# Patient Record
Sex: Female | Born: 1960 | Race: White | Hispanic: No | Smoking: Never smoker
Health system: Southern US, Community
[De-identification: ages and names within clinical notes are randomized; demographics above are authoritative.]

## PROBLEM LIST (undated history)

## (undated) DIAGNOSIS — E119 Type 2 diabetes mellitus without complications: Secondary | ICD-10-CM

## (undated) DIAGNOSIS — I10 Essential (primary) hypertension: Secondary | ICD-10-CM

## (undated) HISTORY — PX: CHOLECYSTECTOMY: SHX55

---

## 2012-10-19 ENCOUNTER — Emergency Department: Payer: Self-pay | Admitting: Internal Medicine

## 2015-09-24 ENCOUNTER — Other Ambulatory Visit: Payer: Self-pay | Admitting: Rehabilitation

## 2015-09-24 DIAGNOSIS — M5416 Radiculopathy, lumbar region: Secondary | ICD-10-CM

## 2015-09-30 ENCOUNTER — Ambulatory Visit
Admission: RE | Admit: 2015-09-30 | Discharge: 2015-09-30 | Disposition: A | Discharge status: Home / Self Care | Payer: Managed Care, Other (non HMO) | Source: Ambulatory Visit | Attending: Rehabilitation | Admitting: Rehabilitation

## 2015-09-30 DIAGNOSIS — M5416 Radiculopathy, lumbar region: Secondary | ICD-10-CM

## 2017-04-04 IMAGING — MR MR LUMBAR SPINE W/O CM
4 of 5 series · 18 of 48 positions shown · non-contrast
Comparison: None.

CLINICAL DATA: Increased low back pain for 1 year. Right bilateral
buttock pain.

EXAM:
MRI LUMBAR SPINE WITHOUT CONTRAST
TECHNIQUE: Multiplanar, multisequence MR imaging of the lumbar spine was
performed. No intravenous contrast was administered.

[Series 6: T2 · sagittal · 4.0mm · 0.73mm/px · 6 of 15 slices shown (1 of 2)]
[im 1/15]
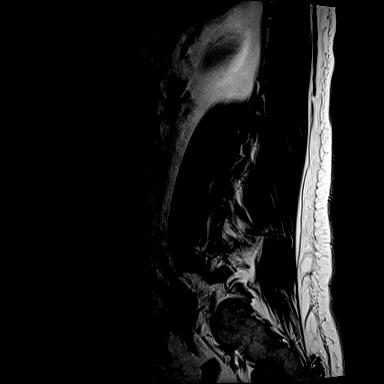
[im 3/15]
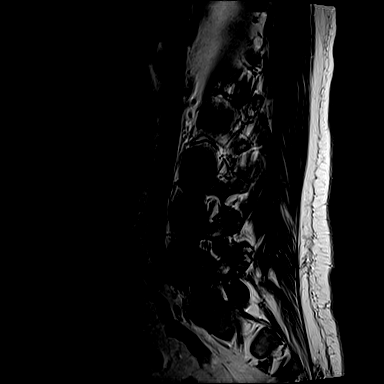
[im 6/15]
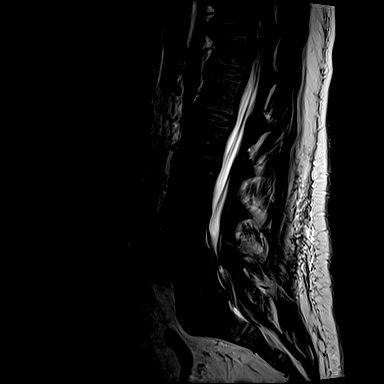
[im 9/15]
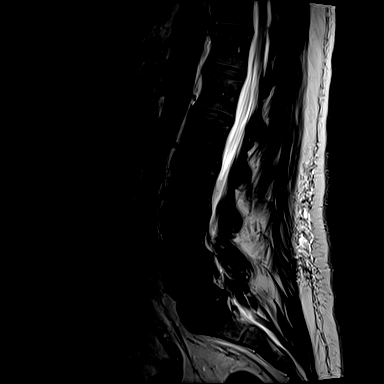
[im 12/15]
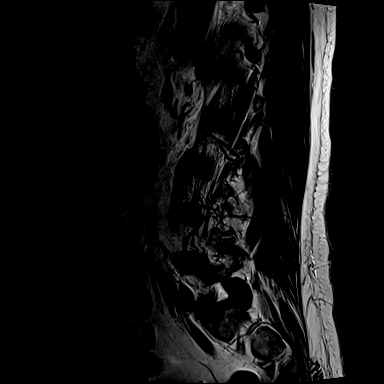
[im 15/15]
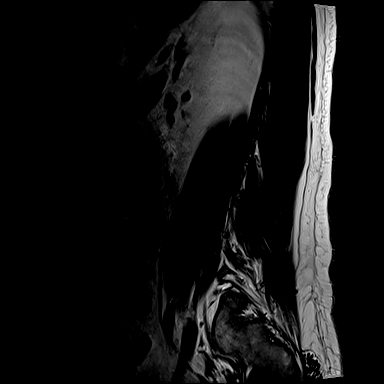

[Series 8: T1 · sagittal · 4.0mm · 0.73mm/px · 3 of 15 slices shown (1 of 2)]
[im 3/15]
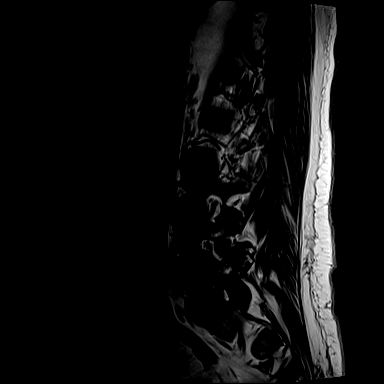
[im 9/15]
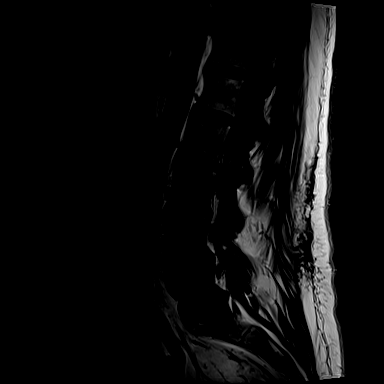
[im 15/15]
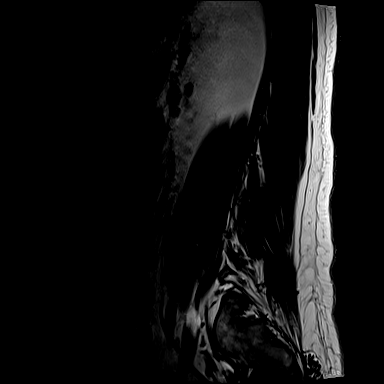

[Series 13: T2 · axial · 4.0mm · 0.28mm/px · z∈[-117,+50]mm · 6 of 37 slices shown (2 of 2)]
[im 1/37]
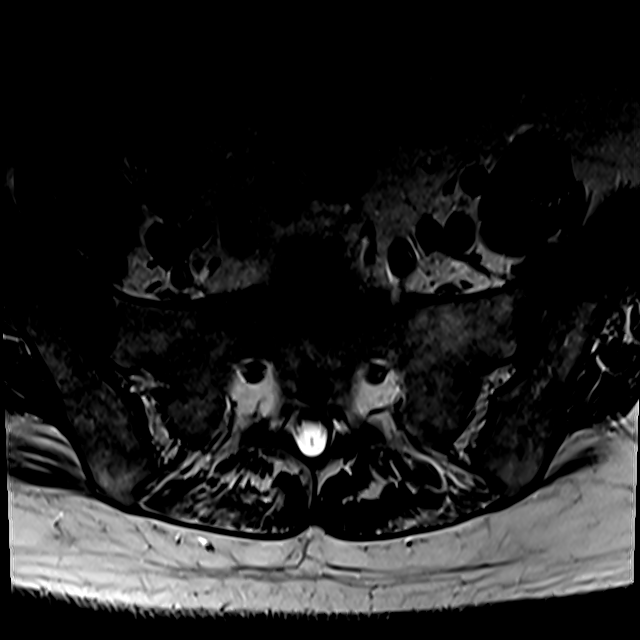
[im 6/37]
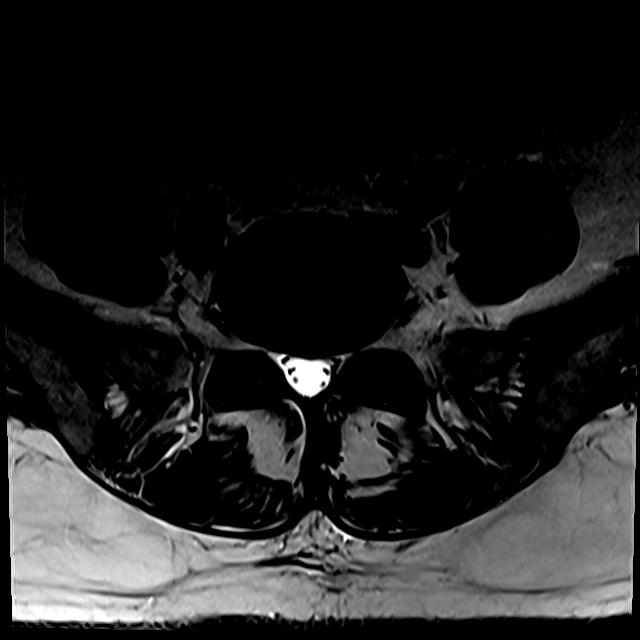
[im 11/37]
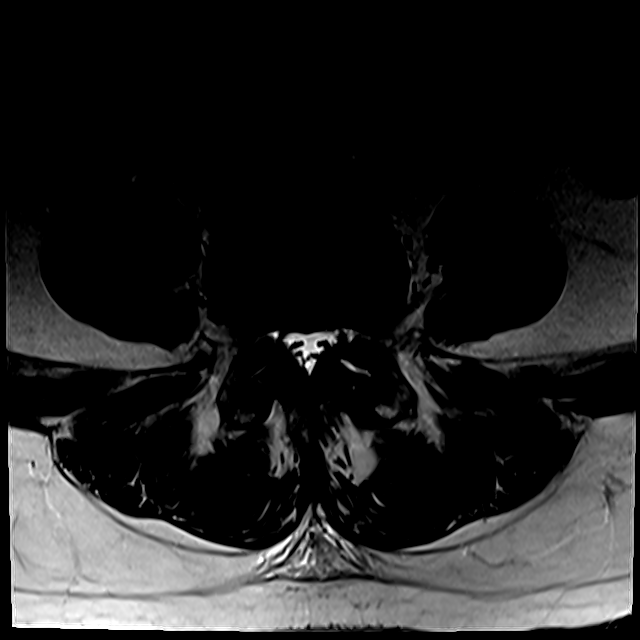
[im 16/37]
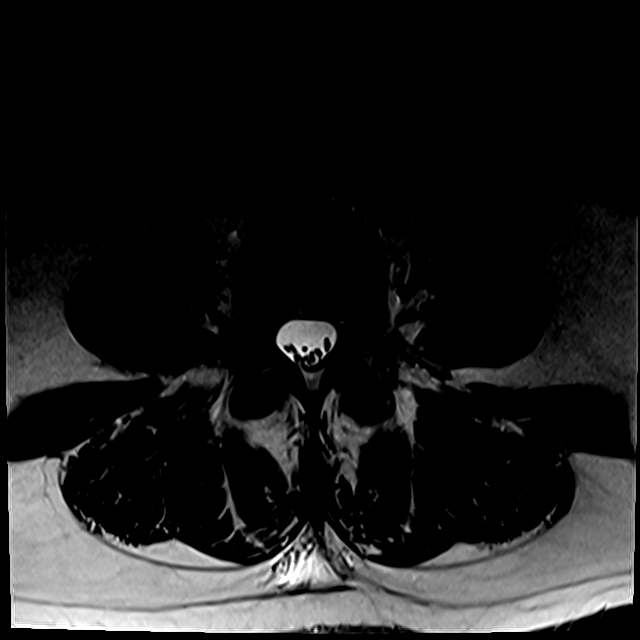
[im 19/37]
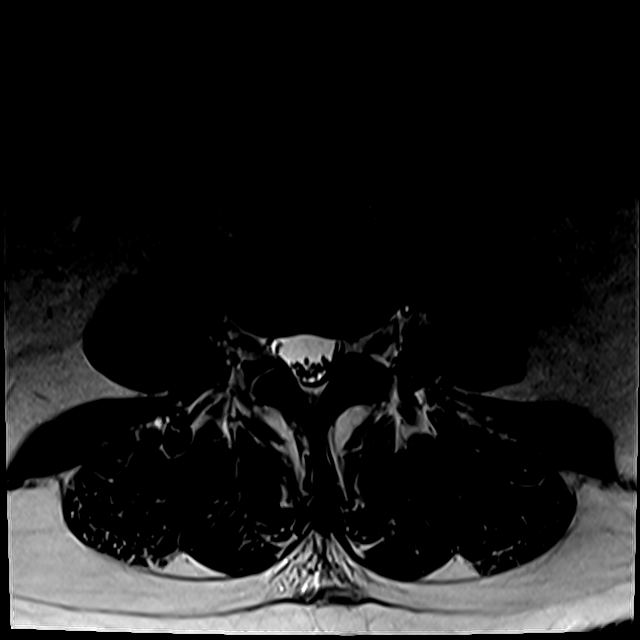
[im 31/37]
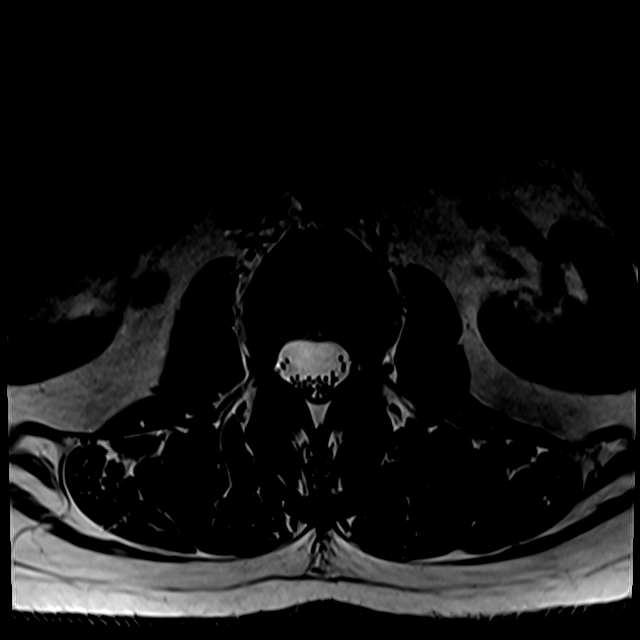

[Series 100: T1 · axial · 4.0mm · 0.28mm/px · z∈[-92,+50]mm · 3 of 37 slices shown (2 of 2)]
[im 6/37]
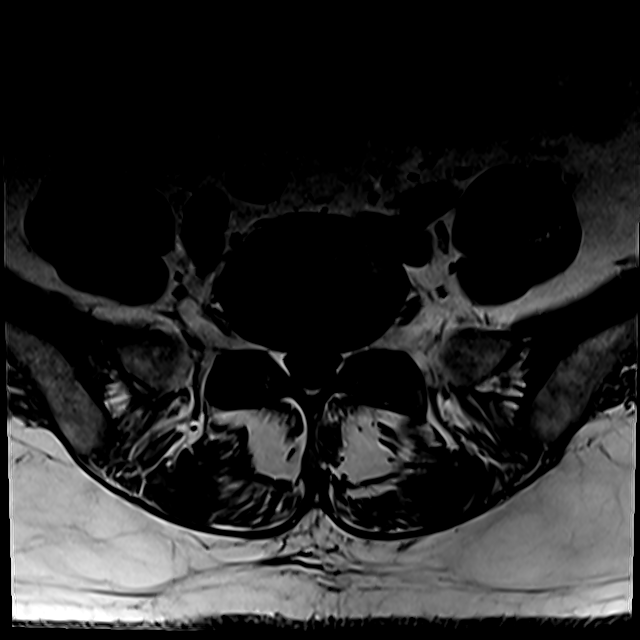
[im 19/37]
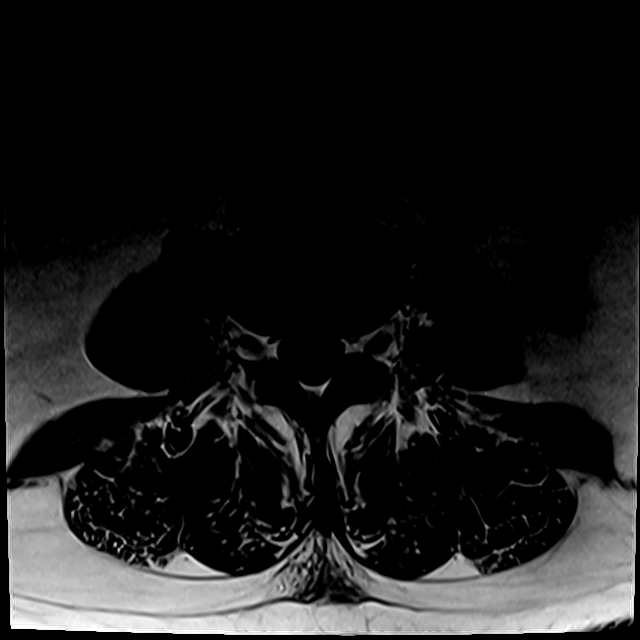
[im 31/37]
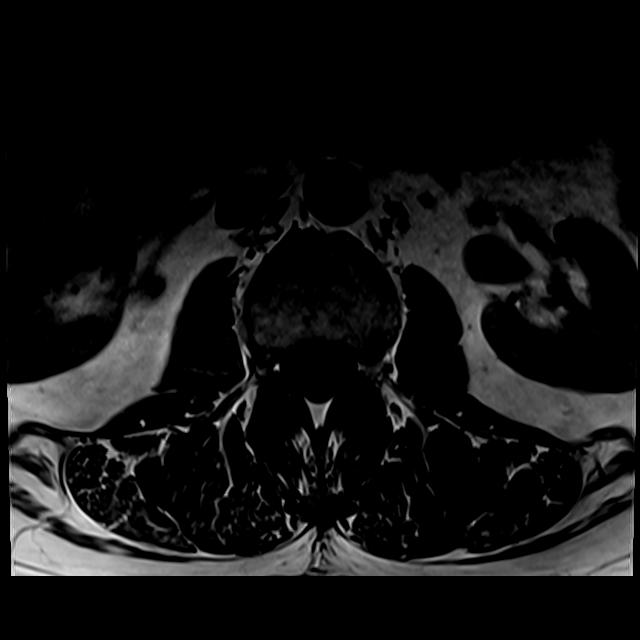

[18 of 48 positions shown; findings below may reference images not displayed]

FINDINGS: Segmentation:  Standard.

Alignment: 4 mm of anterolisthesis of L4 on L5 secondary to
bilateral facet disease.

Vertebrae:  No fracture, evidence of discitis, or bone lesion.

Conus medullaris: Extends to the T12 level and appears normal.

Paraspinal and other soft tissues: Negative.

Disc levels:

Disc spaces: Disc desiccation at L3-4, L4-5 and L5-S1.

T11-12: On the sagittal images there is a mild broad-based disc
bulge without foraminal or central canal stenosis.

T12-L1: On the sagittal images, there is no significant disc bulge.
No evidence of neural foraminal stenosis. No central canal stenosis.

L1-L2: No significant disc bulge. No evidence of neural foraminal
stenosis. No central canal stenosis.

L2-L3: No significant disc bulge. No evidence of neural foraminal
stenosis. No central canal stenosis.

L3-L4: Mild broad-based disc bulge. Mild bilateral facet
arthropathy. No evidence of neural foraminal stenosis. No central
canal stenosis.

L4-L5: Mild broad-based disc bulge flattening the ventral thecal
sac. Severe bilateral facet arthropathy with ligamentum flavum
infolding resulting in moderate spinal stenosis and right lateral
recess stenosis. No evidence of neural foraminal stenosis.

L5-S1: Broad central disc protrusion abutting the intraspinal S1
nerve roots bilaterally. Mild bilateral facet arthropathy. No
evidence of neural foraminal stenosis. No central canal stenosis.
IMPRESSION: 1. At L4-5 there is a mild broad-based disc bulge flattening the
ventral thecal sac. Severe bilateral facet arthropathy with
ligamentum flavum infolding resulting in moderate spinal stenosis
and right lateral recess stenosis. Grade 1 anterolisthesis of L4 on
L5.
2. At L5-S1 there is a broad central disc protrusion abutting the
intraspinal S1 nerve roots bilaterally. Mild bilateral facet
arthropathy.

## 2017-08-15 ENCOUNTER — Other Ambulatory Visit: Payer: Self-pay

## 2017-08-15 ENCOUNTER — Emergency Department: Payer: Managed Care, Other (non HMO)

## 2017-08-15 ENCOUNTER — Encounter: Payer: Self-pay | Admitting: Emergency Medicine

## 2017-08-15 ENCOUNTER — Emergency Department
Admission: EM | Admit: 2017-08-15 | Discharge: 2017-08-15 | Disposition: A | Discharge status: Home / Self Care | Payer: Managed Care, Other (non HMO) | Attending: Emergency Medicine | Admitting: Emergency Medicine

## 2017-08-15 DIAGNOSIS — J209 Acute bronchitis, unspecified: Secondary | ICD-10-CM | POA: Diagnosis not present

## 2017-08-15 DIAGNOSIS — E119 Type 2 diabetes mellitus without complications: Secondary | ICD-10-CM | POA: Insufficient documentation

## 2017-08-15 DIAGNOSIS — I10 Essential (primary) hypertension: Secondary | ICD-10-CM | POA: Diagnosis not present

## 2017-08-15 DIAGNOSIS — R05 Cough: Secondary | ICD-10-CM | POA: Diagnosis present

## 2017-08-15 HISTORY — DX: Type 2 diabetes mellitus without complications: E11.9

## 2017-08-15 HISTORY — DX: Essential (primary) hypertension: I10

## 2017-08-15 LAB — CBC
HCT: 46.6 % (ref 35.0–47.0)
Hemoglobin: 15.6 g/dL (ref 12.0–16.0)
MCH: 27.8 pg (ref 26.0–34.0)
MCHC: 33.4 g/dL (ref 32.0–36.0)
MCV: 83.4 fL (ref 80.0–100.0)
PLATELETS: 153 10*3/uL (ref 150–440)
RBC: 5.59 MIL/uL — AB (ref 3.80–5.20)
RDW: 13.9 % (ref 11.5–14.5)
WBC: 5.4 10*3/uL (ref 3.6–11.0)

## 2017-08-15 LAB — TROPONIN I: Troponin I: <0.03 ng/mL (ref <0.03)

## 2017-08-15 LAB — BASIC METABOLIC PANEL
Anion gap: 9 (ref 5–15)
BUN: 13 mg/dL (ref 6–20)
CALCIUM: 8.9 mg/dL (ref 8.9–10.3)
CO2: 24 mmol/L (ref 22–32)
CREATININE: 0.86 mg/dL (ref 0.44–1.00)
Chloride: 96 mmol/L — ABNORMAL LOW (ref 101–111)
Glucose, Bld: 300 mg/dL — ABNORMAL HIGH (ref 65–99)
Potassium: 4.2 mmol/L (ref 3.5–5.1)
SODIUM: 129 mmol/L — AB (ref 135–145)

## 2017-08-15 MED ORDER — AZITHROMYCIN 250 MG PO TABS: ORAL_TABLET | ORAL | 0 refills | Status: AC

## 2017-08-15 NOTE — Discharge Instructions (Signed)
You likely either have bronchitis or an early pneumonia.  Take the antibiotic as prescribed and finish the full course.  You can continue to take the guaifenesin for cough, and Tylenol or ibuprofen for fever.  You should check your glucose frequently and do your normal diabetes regimen.  Follow-up with your regular doctor.  Return to the ER for any new or worsening symptoms that concern you.

## 2017-08-15 NOTE — ED Triage Notes (Signed)
Says had cough 2 weeks ago.  It got better, but now last few days she has a cough that hurts her ribs when she coughs.  Fever.

## 2017-08-15 NOTE — ED Provider Notes (Signed)
[facility] Emergency Department Provider Note ____________________________________________   First MD Initiated Contact with Patient 08/15/17 1407     (approximate)  I have reviewed the triage vital signs and the nursing notes.   HISTORY  Chief Complaint Cough and Fever    HPI Morene Crockerracey C Jou is a 57 y.o. female with past medical history of DM and other PMH as noted below who presents with cough for approximately the last week, worse in the last 2 days, nonproductive, associated with fever for the last day as well as some discomfort in her chest with the cough, not associated with shortness of breath.  No sick contacts or other recent illness.  Patient is taking guaifenesin at home with some relief.  Past Medical History:  Diagnosis Date  . Diabetes mellitus without complication (HCC)   . Hypertension     There are no active problems to display for this patient.   Past Surgical History:  Procedure Laterality Date  . CHOLECYSTECTOMY      Prior to Admission medications   Not on File    Allergies Macrobid [nitrofurantoin macrocrystal]  No family history on file.  Social History Social History   Tobacco Use  . Smoking status: Never Smoker  . Smokeless tobacco: Never Used  Substance Use Topics  . Alcohol use: Never    Frequency: Never  . Drug use: Not on file    Review of Systems  Constitutional: Positive for fever Eyes: No redness. ENT: No sore throat. Cardiovascular: Positive for chest discomfort. Respiratory: Denies shortness of breath. Gastrointestinal: No vomiting.  Genitourinary: Negative for frequency.  Musculoskeletal: Negative for back pain. Skin: Negative for rash. Neurological: Negative for headache.   ____________________________________________   PHYSICAL EXAM:  VITAL SIGNS: ED Triage Vitals  Enc Vitals Group     BP 08/15/17 1133 (!) 139/93     Pulse Rate 08/15/17 1133 (!) 108     Resp 08/15/17 1133 18     Temp 08/15/17 1133 100.2 F (37.9 C)     Temp Source 08/15/17 1133 Oral     SpO2 08/15/17 1133 98 %     Weight 08/15/17 1134 185 lb (83.9 kg)     Height 08/15/17 1134 5\' 7"  (1.702 m)     Head Circumference --      Peak Flow --      Pain Score 08/15/17 1134 6     Pain Loc --      Pain Edu? --      Excl. in GC? --     Constitutional: Alert and oriented. Well appearing and in no acute distress. Eyes: Conjunctivae are normal.  Head: Atraumatic. Nose: No congestion/rhinnorhea. Mouth/Throat: Mucous membranes are moist.   Neck: Normal range of motion.  Cardiovascular: Normal rate, regular rhythm. Grossly normal heart sounds.  Good peripheral circulation. Respiratory: Normal respiratory effort.  No retractions. Lungs CTAB. Gastrointestinal: No distention.  Musculoskeletal: No lower extremity edema.  Extremities warm and well perfused.  Neurologic:  Normal speech and language. No gross focal neurologic deficits are appreciated.  Skin:  Skin is warm and dry. No rash noted. Psychiatric: Mood and affect are normal. Speech and behavior are normal.  ____________________________________________   LABS (all labs ordered are listed, but only abnormal results are displayed)  Labs Reviewed  BASIC METABOLIC PANEL - Abnormal; Notable for the following components:      Result Value   Sodium 129 (*)    Chloride 96 (*)    Glucose, Bld 300 (*)  All other components within normal limits  CBC - Abnormal; Notable for the following components:   RBC 5.59 (*)    All other components within normal limits  TROPONIN I   ____________________________________________  EKG  ED ECG REPORT I, Dionne Bucy, the attending physician, personally viewed and interpreted this ECG.  Date: 08/15/2017 EKG Time: 1141 Rate: 96 Rhythm: normal sinus rhythm QRS Axis: normal Intervals: normal ST/T Wave abnormalities: normal Narrative Interpretation: no evidence of acute  ischemia  ____________________________________________  RADIOLOGY  CXR: No focal infiltrate; left base atelectasis  ____________________________________________   PROCEDURES  Procedure(s) performed: No  Procedures  Critical Care performed: No ____________________________________________   INITIAL IMPRESSION / ASSESSMENT AND PLAN / ED COURSE  Pertinent labs & imaging results that were available during my care of the patient were reviewed by me and considered in my medical decision making (see chart for details).  57 year old female with history of DM presents with cough for the last week, worse in the last 2 days and associated with fever.  No significant shortness of breath.  The patient is relatively well-appearing on exam, vital signs are normal here except for low-grade fever, and the remainder the exam is as described above.  Chest x-ray shows left lower lobe atelectasis although in the clinical context this could be an infiltrate.  I will treat with antibiotics.  Given duration of symptoms there is no indication for testing for flu.  Lab workup is reassuring.  Given patient's elevated glucose (states she was not taking her insulin because she has not had an appetite) corrected sodium is 132.  There is no clinical or lab evidence of DKA.  Patient is safe for discharge home and feels well to go home.  Return precautions given, and she expresses understanding.  Clinical Course as of Aug 16 1418  Mon Aug 15, 2017  1409 GFR, Est African American: >60 [SS]    Clinical Course User Index [SS] Dionne Bucy, MD     ____________________________________________   FINAL CLINICAL IMPRESSION(S) / ED DIAGNOSES  Final diagnoses:  Acute bronchitis, unspecified organism      NEW MEDICATIONS STARTED DURING THIS VISIT:  New Prescriptions   No medications on file     Note:  This document was prepared using Dragon voice recognition software and may include unintentional  dictation errors.    Dionne Bucy, MD 08/15/17 1420

## 2017-08-15 NOTE — ED Triage Notes (Signed)
FIRST NURSE NOTE-here for possible PNA per husband.  Cough and SHOB per husband. Has had fever at home.

## 2019-02-18 IMAGING — CR DG CHEST 2V
2 series · 2 of 2 positions shown · non-contrast
Comparison: None.

CLINICAL DATA: Cough for 2 weeks

EXAM:
CHEST - 2 VIEW

[chest pa]
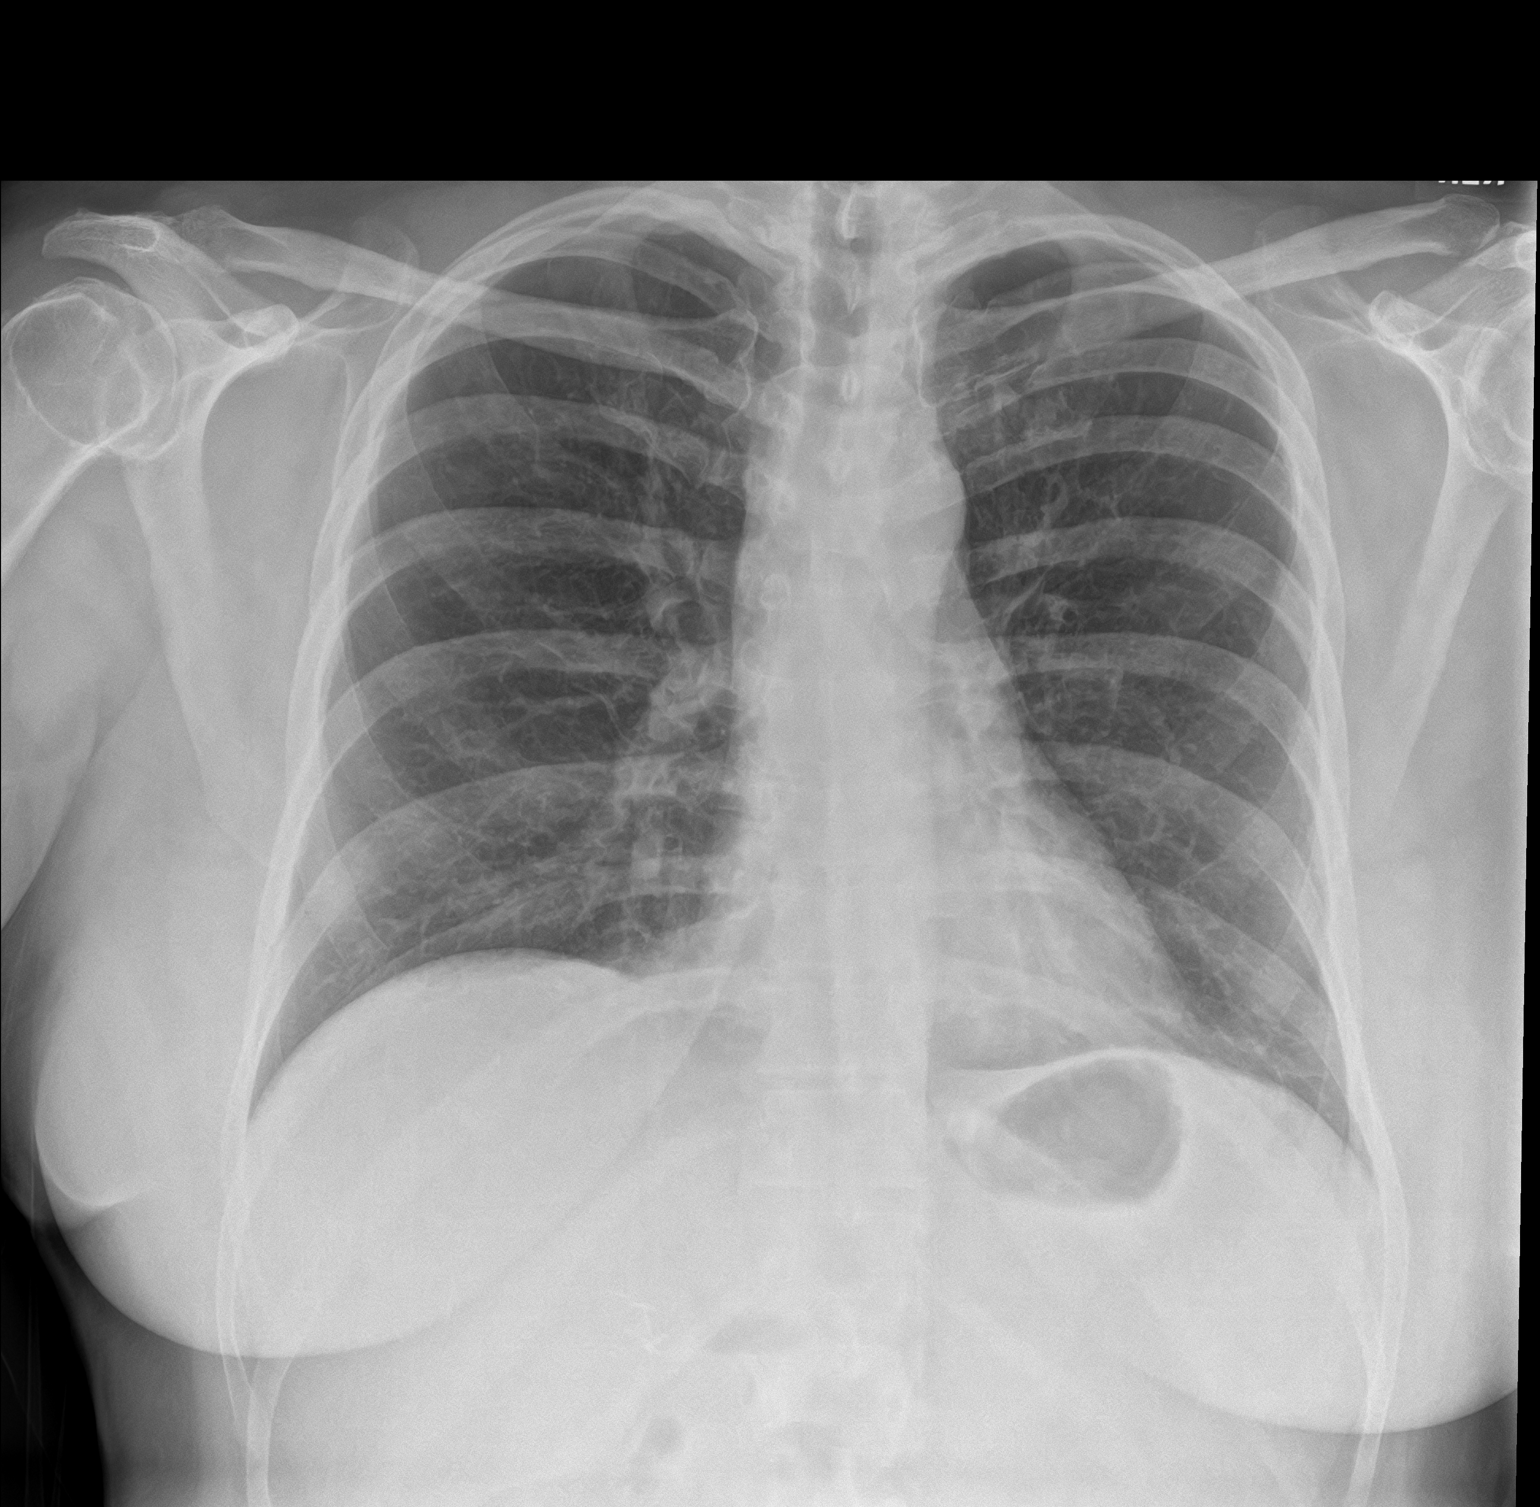

[chest lat]
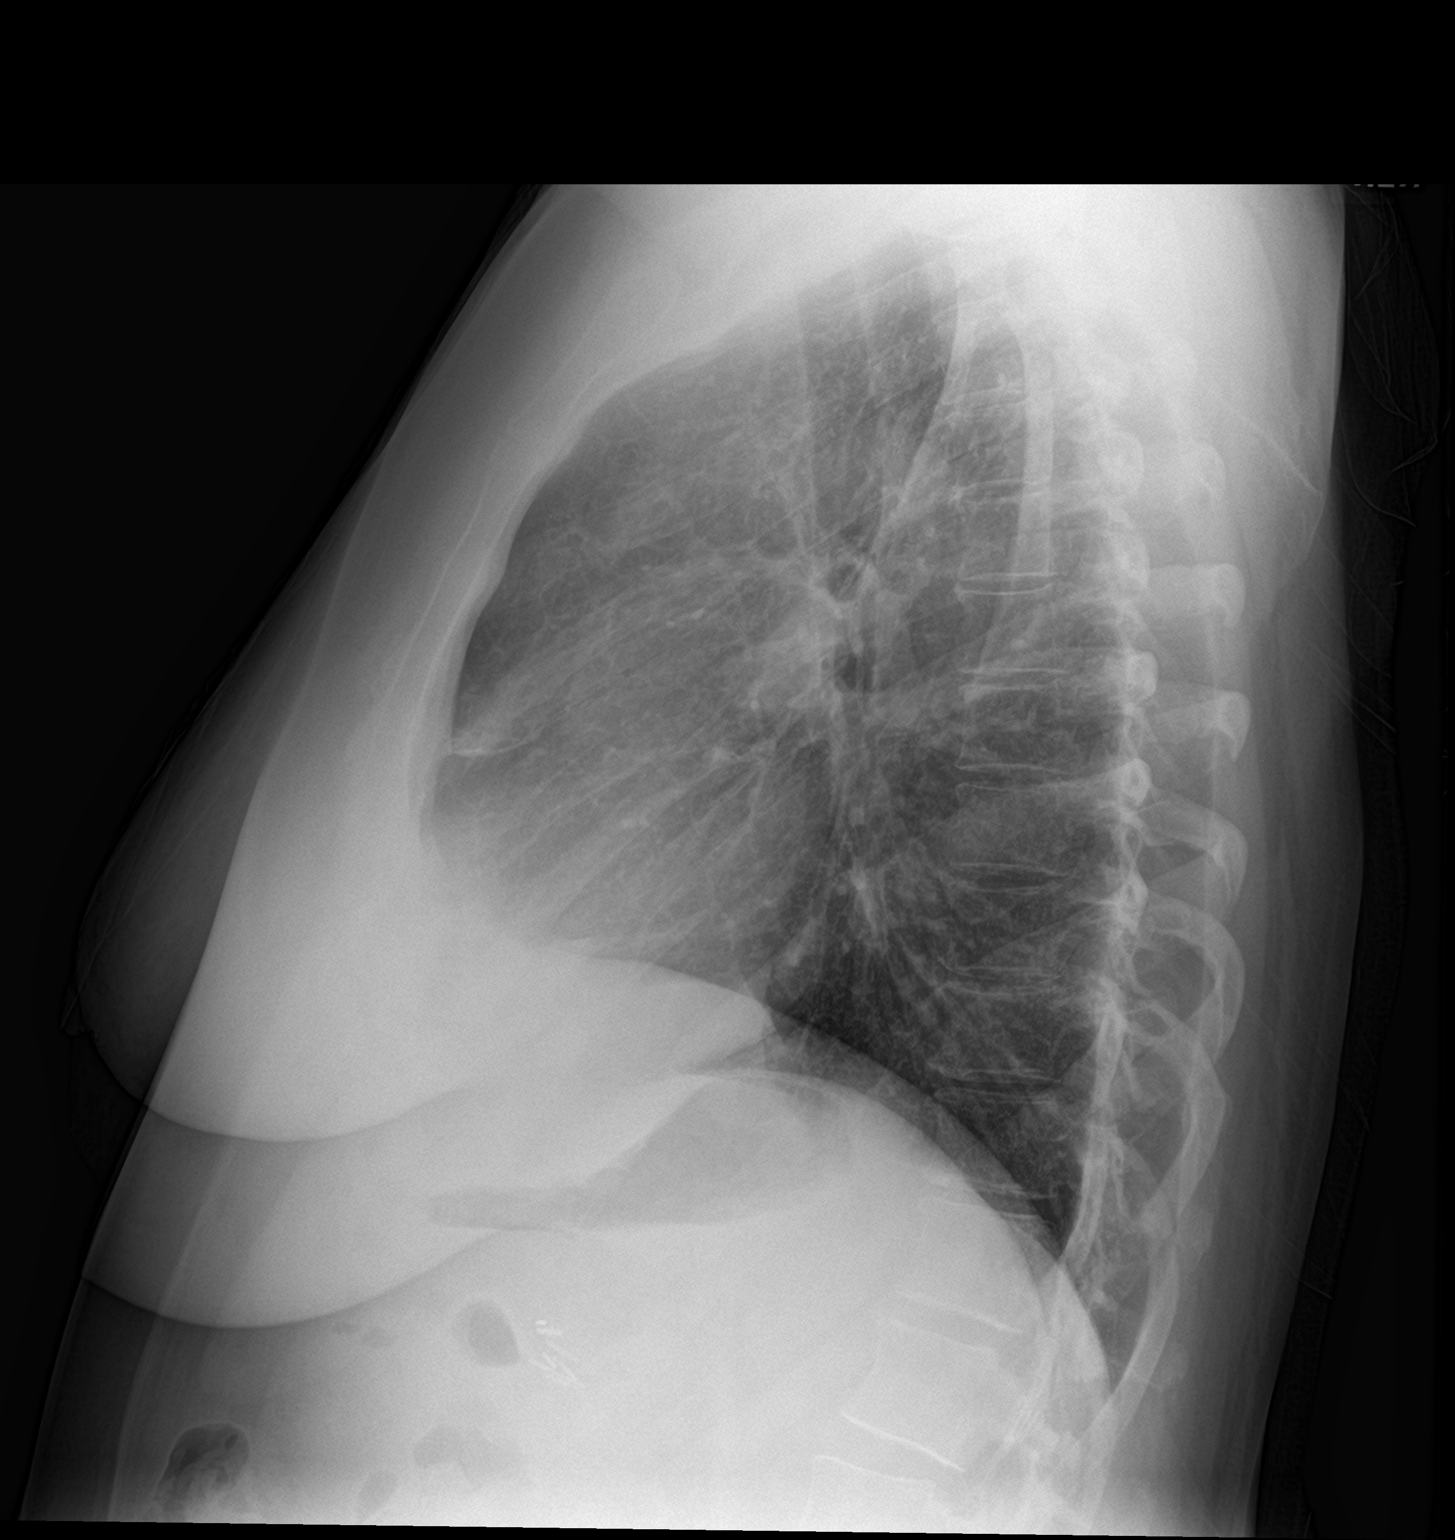

[2 of 2 positions shown; findings below may reference images not displayed]

FINDINGS: There is slight left base atelectasis. No edema or consolidation.
Heart size and pulmonary vascularity are normal. No adenopathy. No
bone lesions.
IMPRESSION: Slight left base atelectasis.  No edema or consolidation.

## 2019-04-03 ENCOUNTER — Ambulatory Visit: Payer: Self-pay | Admitting: Internal Medicine

## 2019-04-30 ENCOUNTER — Ambulatory Visit: Payer: Self-pay | Admitting: Internal Medicine

## 2019-06-06 ENCOUNTER — Ambulatory Visit: Payer: Self-pay | Admitting: Internal Medicine

## 2019-07-20 ENCOUNTER — Encounter: Payer: Self-pay | Admitting: Physical Therapy

## 2019-07-20 ENCOUNTER — Ambulatory Visit: Payer: Managed Care, Other (non HMO) | Attending: Neurology | Admitting: Physical Therapy

## 2019-07-20 DIAGNOSIS — M6281 Muscle weakness (generalized): Secondary | ICD-10-CM | POA: Diagnosis present

## 2019-07-20 DIAGNOSIS — G629 Polyneuropathy, unspecified: Secondary | ICD-10-CM | POA: Insufficient documentation

## 2019-07-21 NOTE — Therapy (Addendum)
[facility] [address] [place], [place], [address] Phone: [phone]   Fax:  [phone]  Physical Therapy Evaluation  Patient Details  Name: ALEXSANDRIA KIVETT MRN: [id number] Date of Birth: [birth year] Referring Provider (PT): Dr. Malvin Johns   Encounter Date: 07/20/2019    PT End of Session - 07/21/19 1407    Visit Number  1    Number of Visits  1    Date for PT Re-Evaluation  07/21/19    PT Start Time  0803    PT Stop Time  0955    PT Time Calculation (min)  112 min    Activity Tolerance  Patient tolerated treatment well;Patient limited by fatigue;Patient limited by pain    Behavior During Therapy  [facility] for tasks assessed/performed        Past Medical History:  Diagnosis Date  . Diabetes mellitus without complication (HCC)   . Hypertension     Past Surgical History:  Procedure Laterality Date  . CHOLECYSTECTOMY      There were no vitals filed for this visit.            See [facility] (faxed report to Dr. Malvin Johns).       Plan - 08/14/19 1231    Clinical Impression Statement  Minimal Overall Level of Work: Falls within the Light range.  Exerting up to 20 pounds of force occasionally, and/or up to 10 pounds of force frequently, and/or a negligible amount of force constantly (Constantly: activity or condition exist 2/3 or more of the time) to move objects.  Physical demand requirements are in excess of those for Sedentary Work.  Even though the weight lifted may be only a negligible amount, a job should be rated Light Work: (1) when it requires walking or standing to significant degree; or (2) when it requires sitting most of the time but entails pushing and/or pulling of arm or leg controls; and/or (3) when the job requires working at a production rate pace entailing the constant pushing and/or pulling of materials even though the weight of those materials is negligible.  NOTE: The constant stress and strain of maintaining a  production rate pace, especially in an industrial setting, can be and is physically demanding of a worker even though the amount of force exerted is negligible.  Please note that the overall level of work was significantly influenced by the client's self-limiting behavior.  Therefore, the Light level of work indicates a minimum ability rather than a maximum ability.  A maximum overall level of work cannot be determined at this time due to the self-limiting behavior.  Please see the Task Performance Table for specific abilities.  Tolerance for the 8-Hour Day: Please note that the client has limited standing and walking tolerance for the 8-hour day/40-hour week at the Light level of work. However, due to the self-limiting behavior on these tasks, this represents a minimal ability and not a maximum ability. A maximum ability could not be determined at this time due to the self-limiting behavior. Regardless of this self-limiting performance, the client will be able to alternate among standing and/or walking and other tasks noted in the task performance table to maximize work tolerance to the 8-hour day/40-hour week.  Please note that the tolerance for the 8-hour day was significantly influenced by the client's self-limiting behavior and indicates her minimal rather than her maximal ability.    Stability/Clinical Decision Making  Evolving/Moderate complexity    Clinical Decision Making  Moderate  Rehab Potential  Fair    PT Frequency  One time visit    PT Treatment/Interventions  ADLs/Self Care Home Management;Functional mobility training;Neuromuscular re-education    PT Next Visit Plan  [facility] only       Patient will benefit from skilled therapeutic intervention in order to improve the following deficits and impairments:  Abnormal gait, Decreased balance, Decreased endurance, Decreased mobility, Decreased activity tolerance, Pain, Postural dysfunction  Visit Diagnosis: Neuropathy  Muscle weakness  (generalized)     Problem List There are no problems to display for this patient.  Pura Spice, PT, DPT # [phone] 08/14/2019, 12:33 PM   [facility] [address] [place], [place], [address] Phone: [phone]   Fax:  [phone]  Name: CESIAH WESTLEY MRN: [id number] Date of Birth: Jan 10, 1961

## 2019-08-14 NOTE — Addendum Note (Signed)
Addended by: Cammie Mcgee on: 08/14/2019 12:37 PM   Modules accepted: Orders
